# Patient Record
Sex: Male | Born: 1985 | Race: White | Hispanic: No | Marital: Single | Smoking: Former smoker
Health system: Southern US, Community
[De-identification: ages and names within clinical notes are randomized; demographics above are authoritative.]

## PROBLEM LIST (undated history)

## (undated) DIAGNOSIS — F909 Attention-deficit hyperactivity disorder, unspecified type: Secondary | ICD-10-CM

## (undated) HISTORY — PX: COLONOSCOPY WITH PROPOFOL: SHX5780

## (undated) HISTORY — PX: DRUG INDUCED ENDOSCOPY: SHX6808

## (undated) HISTORY — PX: WISDOM TOOTH EXTRACTION: SHX21

---

## 2010-10-17 ENCOUNTER — Encounter: Payer: Self-pay | Admitting: Gastroenterology

## 2010-10-17 ENCOUNTER — Ambulatory Visit (INDEPENDENT_AMBULATORY_CARE_PROVIDER_SITE_OTHER): Payer: Managed Care, Other (non HMO) | Admitting: Gastroenterology

## 2010-10-17 ENCOUNTER — Other Ambulatory Visit (INDEPENDENT_AMBULATORY_CARE_PROVIDER_SITE_OTHER): Payer: Managed Care, Other (non HMO)

## 2010-10-17 VITALS — BP 124/68 | HR 88 | Ht 75.0 in | Wt 162.0 lb

## 2010-10-17 DIAGNOSIS — R197 Diarrhea, unspecified: Secondary | ICD-10-CM

## 2010-10-17 LAB — COMPREHENSIVE METABOLIC PANEL
ALT: 19 U/L (ref 0–53)
AST: 19 U/L (ref 0–37)
Albumin: 4.3 g/dL (ref 3.5–5.2)
Alkaline Phosphatase: 87 U/L (ref 39–117)
Glucose, Bld: 87 mg/dL (ref 70–99)
Potassium: 5 mEq/L (ref 3.5–5.1)
Sodium: 140 mEq/L (ref 135–145)
Total Bilirubin: 0.6 mg/dL (ref 0.3–1.2)
Total Protein: 7.4 g/dL (ref 6.0–8.3)

## 2010-10-17 LAB — CBC WITH DIFFERENTIAL/PLATELET
Basophils Absolute: 0 10*3/uL (ref 0.0–0.1)
Eosinophils Absolute: 0 10*3/uL (ref 0.0–0.7)
Lymphocytes Relative: 35.1 % (ref 12.0–46.0)
MCHC: 34.7 g/dL (ref 30.0–36.0)
MCV: 88.5 fl (ref 78.0–100.0)
Monocytes Absolute: 0.5 10*3/uL (ref 0.1–1.0)
Neutrophils Relative %: 55.1 % (ref 43.0–77.0)
Platelets: 220 10*3/uL (ref 150.0–400.0)
RDW: 13 % (ref 11.5–14.6)

## 2010-10-17 MED ORDER — PEG-KCL-NACL-NASULF-NA ASC-C 100 G PO SOLR: 1.0000 | Freq: Once | ORAL | Status: AC

## 2010-10-17 NOTE — Patient Instructions (Addendum)
Colonoscopy A colonoscopy is an exam to evaluate your entire colon. In this exam, your colon is cleansed. A long fiberoptic tube is inserted through your rectum and into your colon. The fiberoptic scope (endoscope) is a long bundle of enclosed and very flexible fibers. These fibers transmit light to the area examined and send images from that area to your caregiver. Discomfort is usually minimal. You may be given a drug to help you sleep (sedative) during or prior to the procedure. This exam helps to detect lumps (tumors), polyps, inflammation, and areas of bleeding. Your caregiver may also take a small piece of tissue (biopsy) that will be examined under a microscope. BEFORE THE PROCEDURE  A clear liquid diet may be required for 2 days before the exam.   Liquid injections (enemas) or laxatives may be required.   A large amount of electrolyte solution may be given to you to drink over a short period of time. This solution is used to clean out your colon.   You should be present 100   prior to your procedure or as directed by your caregiver.   Check in at the admissions desk to fill out necessary forms if not preregistered. There will be consent forms to sign prior to the procedure. If accompanied by friends or family, there is a waiting area for them while you are having your procedure.  LET YOUR CAREGIVER KNOW ABOUT:  Allergies to food or medicine.  Medicines taken, including vitamins, herbs, eyedrops, over-the-counter medicines, and creams.   Use of steroids (by mouth or creams).   Previous problems with anesthetics or numbing medicines.   History of bleeding problems or blood clots.  Previous surgery.   Other health problems, including diabetes and kidney problems.   Possibility of pregnancy, if this applies.   AFTER THE PROCEDURE  If you received a sedative and/or pain medicine, you will need to arrange for someone to drive you home.   Occasionally, there is a  little blood passed with the first bowel movement. DO NOT be concerned.  HOME CARE INSTRUCTIONS  It is not unusual to pass moderate amounts of gas and experience mild abdominal cramping following the procedure. This is due to air being used to inflate your colon during the exam. Walking or a warm pack on your belly (abdomen) may help.   You may resume all normal meals and activities after sedatives and medicines have worn off.   Only take over-the-counter or prescription medicines for pain, discomfort, or fever as directed by your caregiver. DO NOT use aspirin or blood thinners if a biopsy was taken. Consult your caregiver for medicine usage if biopsies were taken.  FINDING OUT THE RESULTS OF YOUR TEST Not all test results are available during your visit. If your test results are not back during the visit, make an appointment with your caregiver to find out the results. Do not assume everything is normal if you have not heard from your caregiver or the medical facility. It is important for you to follow up on all of your test results. SEEK IMMEDIATE MEDICAL CARE IF:  You have an oral temperature above 100, not controlled by medicine.   You pass large blood clots or fill a toilet with blood following the procedure. This may also occur 10 to 14 days following the procedure. This is more likely if a biopsy was taken.   You develop abdominal pain that keeps getting worse and cannot be relieved  with medicine.  Document Released: 05/17/2000 Document Re-Released: 08/14/2009 [facility] Patient Information 2011 [place], [place].   You have been scheduled for a Colonoscopy . Your prep has been sent to your pharmacy. Please go to the basement lab today for lab work.

## 2010-10-17 NOTE — Assessment & Plan Note (Signed)
Symptoms could be due to diarrhea-predominant IBS or from inflammatory bowel disease. He is not reliably intolerant to dairy products.  Recommendations #1 check CBC, CRP and comprehensive metabolic profile #2 colonoscopy  Risks, alternatives, and complications of the procedure, including bleeding, perforation, and possible need for surgery, were explained to the patient.  Patient's questions were answered.

## 2010-10-17 NOTE — Progress Notes (Signed)
History of Present Illness:  Jose Shaw is a 25 year old white male self-referred for evaluation of diarrhea. For several years he's been having GI issues including severe episodes of diarrhea with urgency and lower abdominal pain. He typically has diarrhea 4 days out of the week. During those days he may go 5-6 times and occasionally at night. He rarely has seen blood on the toilet tissue. Symptoms are not specific to any particular foods. Weight has been stable. There is no family history of GI disease.    Review of Systems: Pertinent positive and negative review of systems were noted in the above HPI section. All other review of systems were otherwise negative.    Current Medications, Allergies, Past Medical History, Past Surgical History, Family History and Social History were reviewed in [employer] electronic medical record  Vital signs were reviewed in today's medical record. Physical Exam: General: Well developed , well nourished, no acute distress Head: Normocephalic and atraumatic Eyes:  sclerae anicteric, EOMI Ears: Normal auditory acuity Mouth: No deformity or lesions Lungs: Clear throughout to auscultation Heart: Regular rate and rhythm; no murmurs, rubs or bruits Abdomen: Soft, non tender and non distended. No masses, hepatosplenomegaly or hernias noted. Normal Bowel sounds Rectal:deferred Musculoskeletal: Symmetrical with no gross deformities  Pulses:  Normal pulses noted Extremities: No clubbing, cyanosis, edema or deformities noted Neurological: Alert oriented x 4, grossly nonfocal Psychological:  Alert and cooperative. Normal mood and affect

## 2010-10-18 LAB — C-REACTIVE PROTEIN: CRP: 0 mg/dL (ref <0.6)

## 2010-10-24 ENCOUNTER — Ambulatory Visit (AMBULATORY_SURGERY_CENTER): Payer: Managed Care, Other (non HMO) | Admitting: Gastroenterology

## 2010-10-24 ENCOUNTER — Encounter: Payer: Self-pay | Admitting: Gastroenterology

## 2010-10-24 DIAGNOSIS — R197 Diarrhea, unspecified: Secondary | ICD-10-CM

## 2010-10-24 DIAGNOSIS — K5150 Left sided colitis without complications: Secondary | ICD-10-CM

## 2010-10-24 MED ORDER — MESALAMINE 1.2 G PO TBEC: 2400.0000 mg | DELAYED_RELEASE_TABLET | Freq: Every day | ORAL | Status: DC

## 2010-10-24 MED ORDER — SODIUM CHLORIDE 0.9 % IV SOLN: 500.0000 mL | INTRAVENOUS | Status: DC

## 2010-10-24 NOTE — Patient Instructions (Signed)
Discharged instructions given with verbal understanding. Biopsies taken only. Resume previous medications.

## 2010-10-25 ENCOUNTER — Telehealth: Payer: Self-pay | Admitting: *Deleted

## 2010-10-25 NOTE — Telephone Encounter (Signed)
No answer.. on home #

## 2014-03-23 ENCOUNTER — Encounter (HOSPITAL_COMMUNITY): Payer: Self-pay | Admitting: Emergency Medicine

## 2014-03-23 ENCOUNTER — Emergency Department (HOSPITAL_COMMUNITY): Payer: BC Managed Care – PPO

## 2014-03-23 ENCOUNTER — Emergency Department (HOSPITAL_COMMUNITY)
Admission: EM | Admit: 2014-03-23 | Discharge: 2014-03-23 | Disposition: A | Discharge status: Home / Self Care | Payer: BC Managed Care – PPO | Attending: Emergency Medicine | Admitting: Emergency Medicine

## 2014-03-23 DIAGNOSIS — M545 Low back pain, unspecified: Secondary | ICD-10-CM

## 2014-03-23 DIAGNOSIS — X58XXXA Exposure to other specified factors, initial encounter: Secondary | ICD-10-CM | POA: Diagnosis not present

## 2014-03-23 DIAGNOSIS — Z79899 Other long term (current) drug therapy: Secondary | ICD-10-CM | POA: Diagnosis not present

## 2014-03-23 DIAGNOSIS — Y92838 Other recreation area as the place of occurrence of the external cause: Secondary | ICD-10-CM | POA: Diagnosis not present

## 2014-03-23 DIAGNOSIS — Z87891 Personal history of nicotine dependence: Secondary | ICD-10-CM | POA: Diagnosis not present

## 2014-03-23 DIAGNOSIS — S39012A Strain of muscle, fascia and tendon of lower back, initial encounter: Secondary | ICD-10-CM | POA: Diagnosis not present

## 2014-03-23 DIAGNOSIS — Y93B3 Activity, free weights: Secondary | ICD-10-CM | POA: Insufficient documentation

## 2014-03-23 DIAGNOSIS — S29002A Unspecified injury of muscle and tendon of back wall of thorax, initial encounter: Secondary | ICD-10-CM | POA: Diagnosis present

## 2014-03-23 MED ORDER — NAPROXEN 500 MG PO TABS: 500.0000 mg | ORAL_TABLET | Freq: Two times a day (BID) | ORAL | Status: DC

## 2014-03-23 MED ORDER — DIAZEPAM 5 MG PO TABS
10.0000 mg | ORAL_TABLET | Freq: Once | ORAL | Status: AC
  Administered 2014-03-23: 10 mg via ORAL
  Filled 2014-03-23: qty 2

## 2014-03-23 MED ORDER — CYCLOBENZAPRINE HCL 10 MG PO TABS: 10.0000 mg | ORAL_TABLET | Freq: Two times a day (BID) | ORAL | Status: DC | PRN

## 2014-03-23 MED ORDER — OXYCODONE-ACETAMINOPHEN 5-325 MG PO TABS
1.0000 | ORAL_TABLET | Freq: Once | ORAL | Status: AC
  Administered 2014-03-23: 1 via ORAL
  Filled 2014-03-23: qty 1

## 2014-03-23 NOTE — ED Provider Notes (Signed)
Medical screening examination/treatment/procedure(s) were performed by non-physician practitioner and as supervising physician I was immediately available for consultation/collaboration.   EKG Interpretation None        Zale Marcotte, MD 03/23/14 0719 

## 2014-03-23 NOTE — ED Notes (Signed)
Pt reporting left lower back pain after working out.  Sts he heard a pop.  Pain radiates down left leg. No tenderness palpated.

## 2014-03-23 NOTE — Discharge Instructions (Signed)

## 2014-03-23 NOTE — ED Provider Notes (Signed)
CSN: [id number]     Arrival date & time 03/23/14  0013 History   First MD Initiated Contact with Patient 03/23/14 0138     Chief Complaint  Patient presents with  . Back Pain     (Consider location/radiation/quality/duration/timing/severity/associated sxs/prior Treatment) Patient is a 28 y.o. male presenting with back pain. The history is provided by the patient. No language interpreter was used.  Back Pain Location:  Lumbar spine Quality:  Cramping and aching Pain severity:  Moderate Onset quality:  Sudden Duration:  3 hours Timing:  Constant Progression:  Unchanged Chronicity:  New Context: lifting heavy objects   Worsened by:  Ambulation Ineffective treatments:  None tried Associated symptoms: no bladder incontinence, no bowel incontinence, no leg pain, no numbness, no paresthesias and no perianal numbness     History reviewed. No pertinent past medical history. History reviewed. No pertinent past surgical history. Family History  Problem Relation Age of Onset  . Colon cancer Neg Hx   . Heart disease Father    History  Substance Use Topics  . Smoking status: Former Games developermoker  . Smokeless tobacco: Never Used  . Alcohol Use: Yes     Comment: occassional one drink a week     Review of Systems  Gastrointestinal: Negative for bowel incontinence.  Genitourinary: Negative for bladder incontinence.  Musculoskeletal: Positive for back pain.  Neurological: Negative for numbness and paresthesias.  All other systems reviewed and are negative.     Allergies  Ceclor  Home Medications   Prior to Admission medications   Medication Sig Start Date End Date Taking? Authorizing Provider  Multiple Vitamin (ONE-A-DAY MENS PO) Take 1 tablet by mouth daily.   Yes Historical Provider, MD  Omega-3 Fatty Acids (FISH OIL PO) Take 1 tablet by mouth daily.   Yes Historical Provider, MD   BP 133/73  Pulse 83  Temp(Src) 98.3 F (36.8 C) (Oral)  Resp 18  Ht 6\' 2"  (1.88 m)  Wt 192  lb (87.091 kg)  BMI 24.64 kg/m2  SpO2 98% Physical Exam  Nursing note and vitals reviewed. Constitutional: He is oriented to person, place, and time. He appears well-developed and well-nourished.  HENT:  Head: Normocephalic.  Eyes: Pupils are equal, round, and reactive to light.  Neck: Normal range of motion.  Cardiovascular: Normal rate, regular rhythm and intact distal pulses.   Pulmonary/Chest: Effort normal and breath sounds normal.  Abdominal: Soft.  Musculoskeletal: He exhibits tenderness.       Lumbar back: He exhibits spasm.       Back:  Lymphadenopathy:    He has no cervical adenopathy.  Neurological: He is alert and oriented to person, place, and time.  Skin: Skin is warm and dry.  Psychiatric: He has a normal mood and affect.    ED Course  Procedures (including critical care time) Labs Review Labs Reviewed - No data to display  Imaging Review Dg Lumbar Spine Complete  03/23/2014   CLINICAL DATA:  Pain in the left lower back radiating down the left leg after feeling a pop while lifting weights.  EXAM: LUMBAR SPINE - COMPLETE 4+ VIEW  COMPARISON:  None.  FINDINGS: There is no evidence of lumbar spine fracture. Alignment is normal. Intervertebral disc spaces are maintained.  IMPRESSION: Negative.   Electronically Signed   By: Burman NievesWilliam  Stevens M.D.   On: 03/23/2014 01:24     EKG Interpretation None      MDM   Final diagnoses:  None    Low back  strain. No red flag symptoms.  Anti-inflammatory, muscle relaxant.  Return precautions discussed.    Jimmye Normanavid John Arieon Scalzo, NP 03/23/14 [phone]

## 2014-03-23 NOTE — ED Notes (Signed)
Pt presents with lower back pain after squatting with weights at the gym and "hearing a pop", denies numbness or tingling in legs.  Denies loss of bowel or bladder.

## 2014-03-23 NOTE — ED Notes (Signed)
Pt ambulatory to the restroom with no difficulty. Gait steady.

## 2015-02-24 IMAGING — CR DG LUMBAR SPINE COMPLETE 4+V
5 series · 5 of 5 positions shown · non-contrast
Comparison: None.

CLINICAL DATA: Pain in the left lower back radiating down the left
leg after feeling a pop while lifting weights.

EXAM:
LUMBAR SPINE - COMPLETE 4+ VIEW

[t lumbar spine ap]
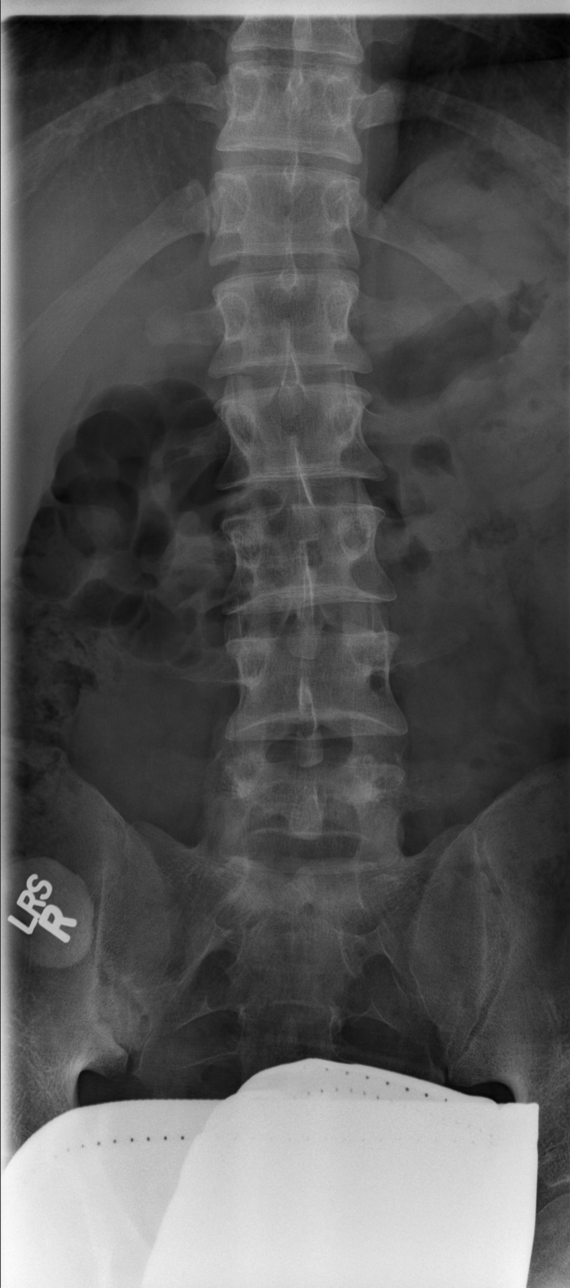

[t lumbar spine obl (1 of 2)]
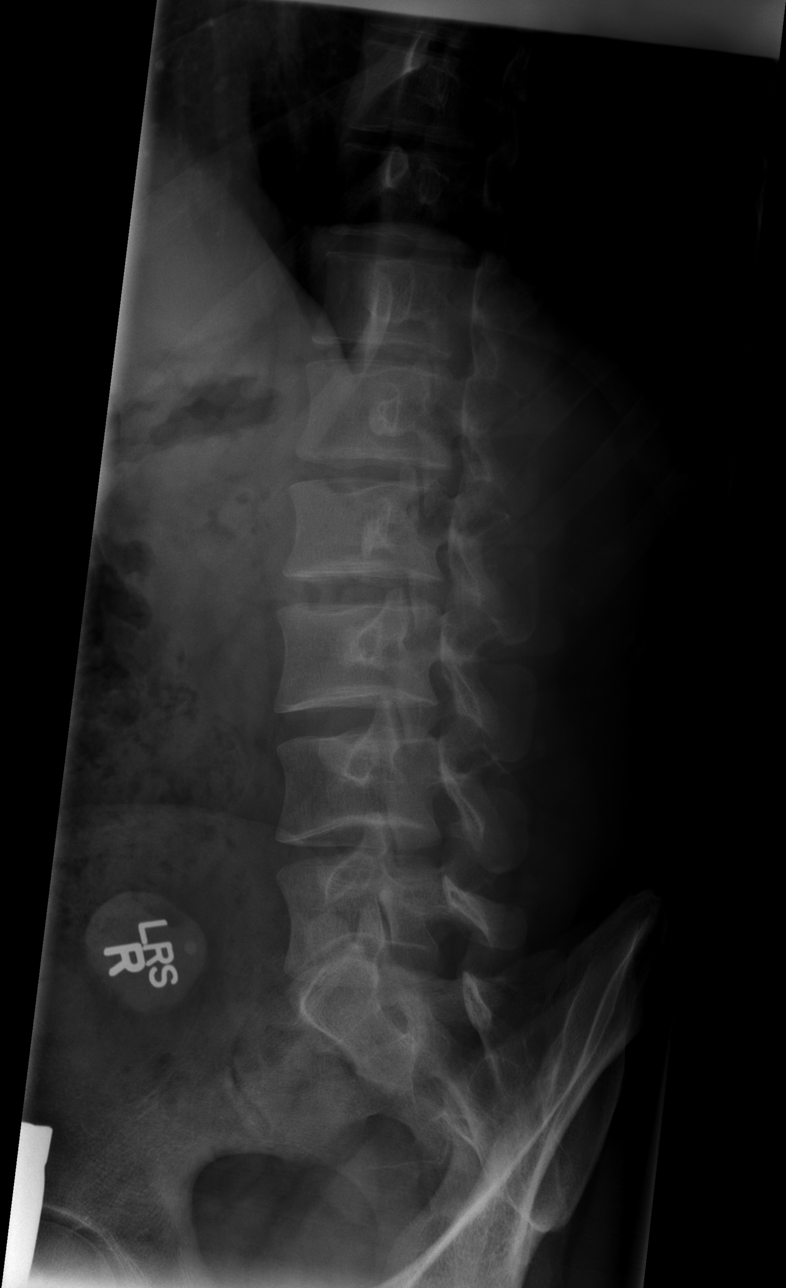

[t lumbar spine obl (2 of 2)]
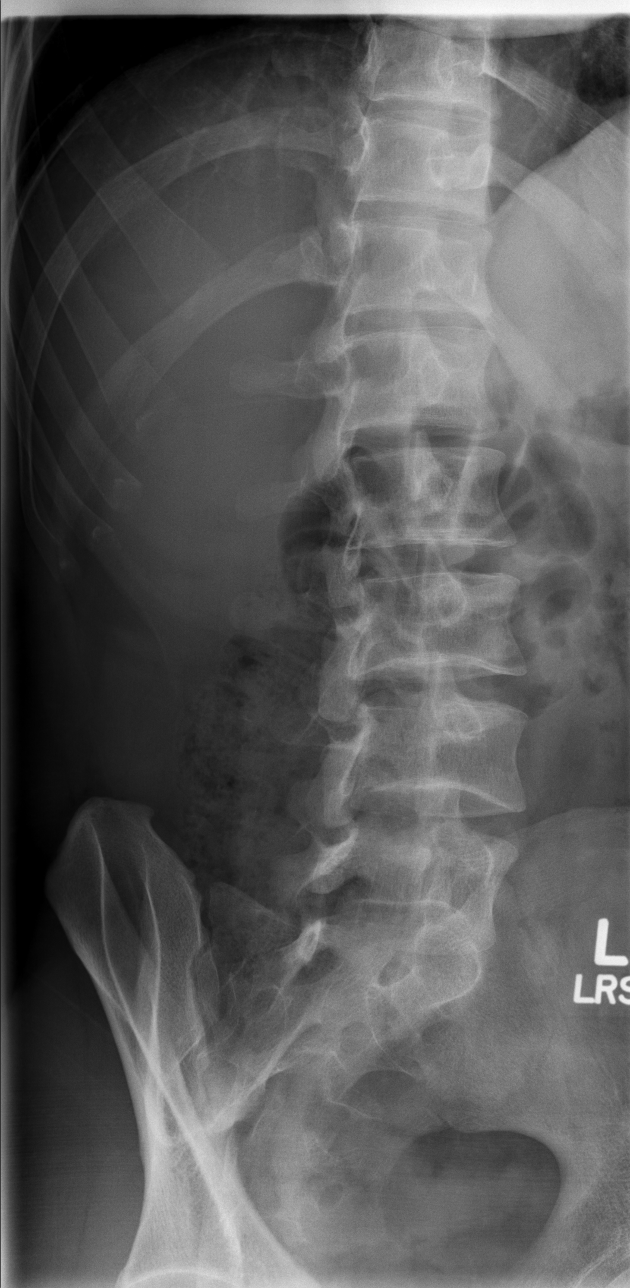

[t lumbar spine lat]
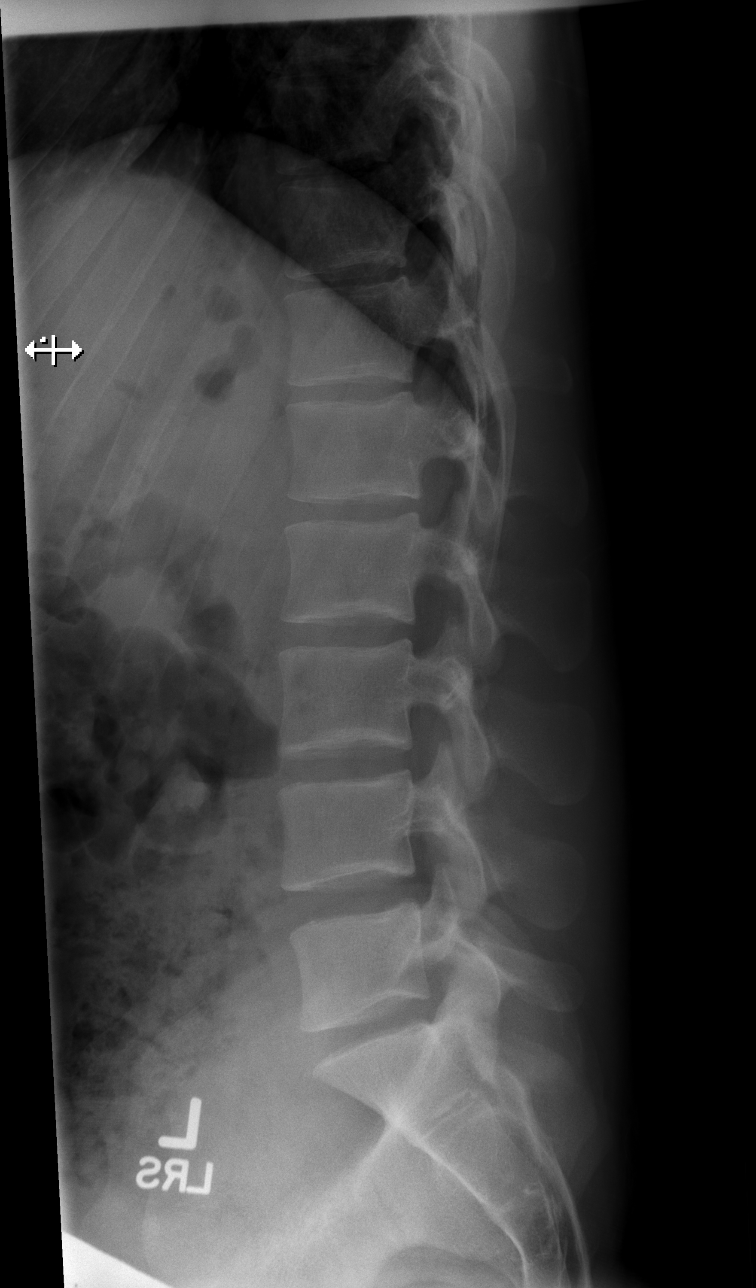

[t lumbar l-5 s-1 spot]
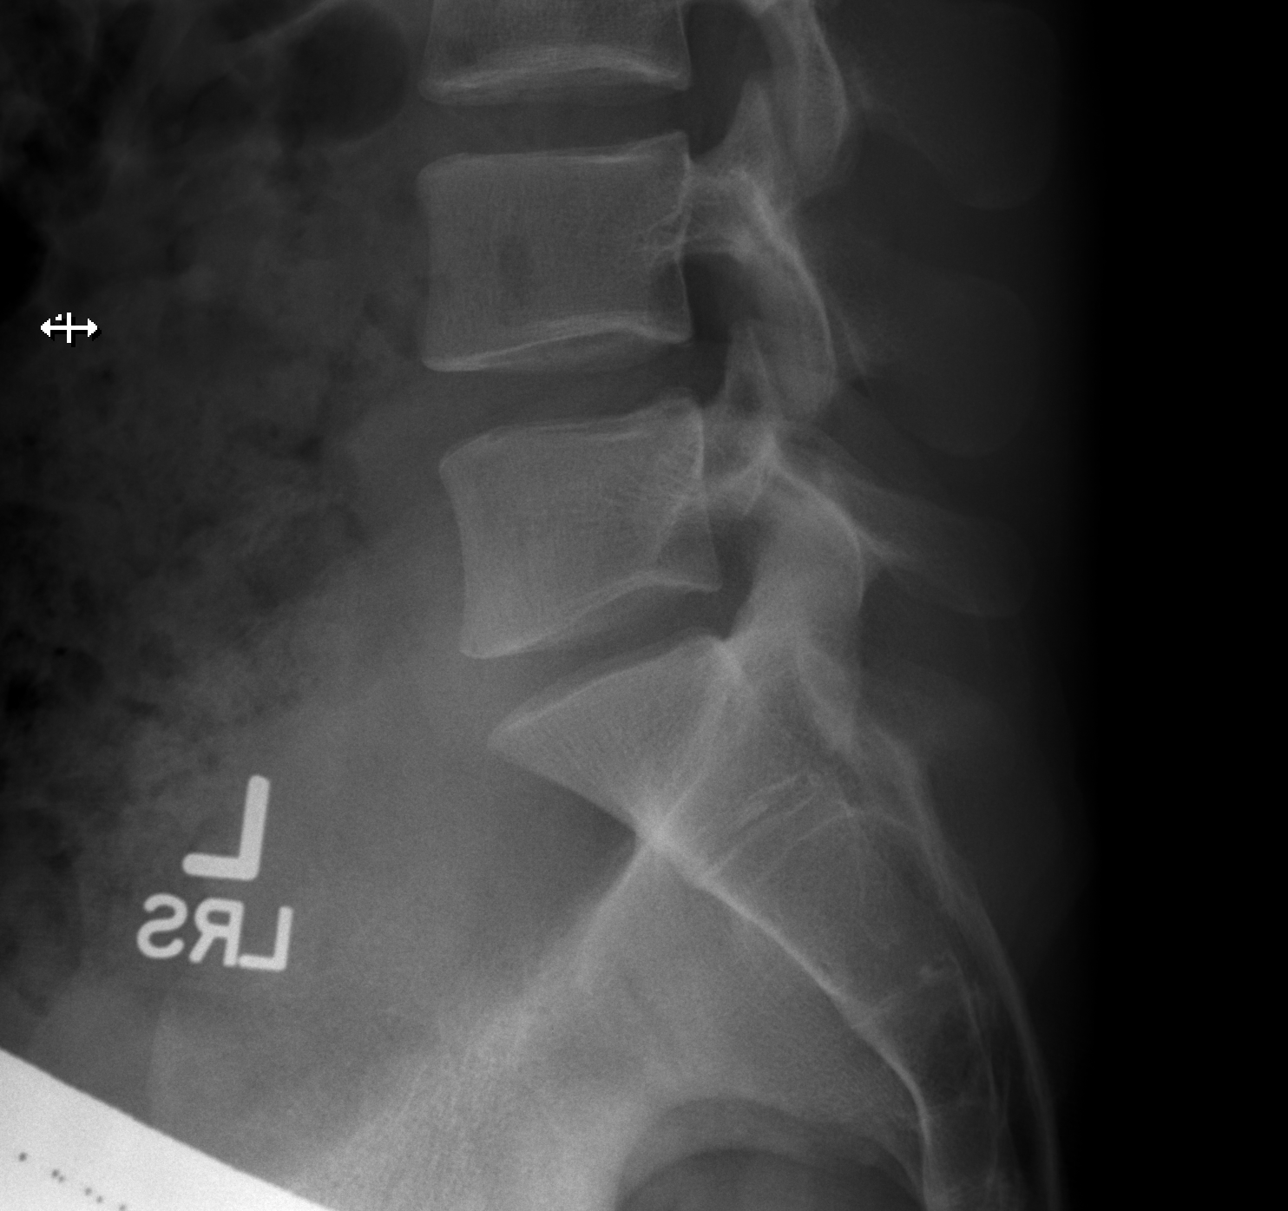

[5 of 5 positions shown; findings below may reference images not displayed]

FINDINGS: There is no evidence of lumbar spine fracture. Alignment is normal.
Intervertebral disc spaces are maintained.
IMPRESSION: Negative.

## 2015-06-13 ENCOUNTER — Ambulatory Visit: Payer: Self-pay

## 2016-01-09 ENCOUNTER — Encounter: Payer: Self-pay | Admitting: Gastroenterology

## 2016-03-27 ENCOUNTER — Encounter: Payer: Self-pay | Admitting: Gastroenterology

## 2016-03-27 ENCOUNTER — Other Ambulatory Visit: Payer: Self-pay

## 2016-03-27 ENCOUNTER — Other Ambulatory Visit (INDEPENDENT_AMBULATORY_CARE_PROVIDER_SITE_OTHER): Payer: BLUE CROSS/BLUE SHIELD

## 2016-03-27 ENCOUNTER — Telehealth: Payer: Self-pay | Admitting: Gastroenterology

## 2016-03-27 ENCOUNTER — Ambulatory Visit (INDEPENDENT_AMBULATORY_CARE_PROVIDER_SITE_OTHER): Payer: BLUE CROSS/BLUE SHIELD | Admitting: Gastroenterology

## 2016-03-27 ENCOUNTER — Encounter (INDEPENDENT_AMBULATORY_CARE_PROVIDER_SITE_OTHER): Payer: Self-pay

## 2016-03-27 VITALS — BP 120/80 | HR 64 | Ht 75.0 in | Wt 176.4 lb

## 2016-03-27 DIAGNOSIS — R152 Fecal urgency: Secondary | ICD-10-CM

## 2016-03-27 DIAGNOSIS — K529 Noninfective gastroenteritis and colitis, unspecified: Secondary | ICD-10-CM | POA: Diagnosis not present

## 2016-03-27 LAB — COMPREHENSIVE METABOLIC PANEL
ALT: 16 U/L (ref 0–53)
AST: 15 U/L (ref 0–37)
Albumin: 4.7 g/dL (ref 3.5–5.2)
Alkaline Phosphatase: 72 U/L (ref 39–117)
BUN: 17 mg/dL (ref 6–23)
CHLORIDE: 103 meq/L (ref 96–112)
CO2: 29 meq/L (ref 19–32)
CREATININE: 1.19 mg/dL (ref 0.40–1.50)
Calcium: 9.8 mg/dL (ref 8.4–10.5)
GFR: 76.92 mL/min (ref >60.00)
Glucose, Bld: 88 mg/dL (ref 70–99)
Potassium: 4.4 mEq/L (ref 3.5–5.1)
Sodium: 139 mEq/L (ref 135–145)
Total Bilirubin: 0.4 mg/dL (ref 0.2–1.2)
Total Protein: 7.4 g/dL (ref 6.0–8.3)

## 2016-03-27 LAB — CBC WITH DIFFERENTIAL/PLATELET
BASOS PCT: 0.9 % (ref 0.0–3.0)
Basophils Absolute: 0 10*3/uL (ref 0.0–0.1)
EOS ABS: 0.1 10*3/uL (ref 0.0–0.7)
Eosinophils Relative: 1 % (ref 0.0–5.0)
HEMATOCRIT: 43.2 % (ref 39.0–52.0)
Hemoglobin: 14.9 g/dL (ref 13.0–17.0)
LYMPHS ABS: 2.1 10*3/uL (ref 0.7–4.0)
Lymphocytes Relative: 38.7 % (ref 12.0–46.0)
MCHC: 34.4 g/dL (ref 30.0–36.0)
MCV: 87.7 fl (ref 78.0–100.0)
Monocytes Absolute: 0.4 10*3/uL (ref 0.1–1.0)
Monocytes Relative: 7.2 % (ref 3.0–12.0)
NEUTROS ABS: 2.9 10*3/uL (ref 1.4–7.7)
NEUTROS PCT: 52.2 % (ref 43.0–77.0)
PLATELETS: 219 10*3/uL (ref 150.0–400.0)
RBC: 4.92 Mil/uL (ref 4.22–5.81)
RDW: 13.4 % (ref 11.5–15.5)
WBC: 5.5 10*3/uL (ref 4.0–10.5)

## 2016-03-27 LAB — TSH: TSH: 2.01 u[IU]/mL (ref 0.35–4.50)

## 2016-03-27 LAB — IGA: IGA: 206 mg/dL (ref 68–378)

## 2016-03-27 LAB — HIGH SENSITIVITY CRP: CRP, High Sensitivity: 0.15 mg/L (ref 0.000–5.000)

## 2016-03-27 MED ORDER — DICYCLOMINE HCL 10 MG PO CAPS: 10.0000 mg | ORAL_CAPSULE | Freq: Three times a day (TID) | ORAL | 3 refills | Status: AC | PRN

## 2016-03-27 NOTE — Patient Instructions (Signed)
If you are age 28 or older, your body mass index should be between 23-30. Your Body mass index is 22.05 kg/m. If this is out of the aforementioned range listed, please consider follow up with your Primary Care Provider.  If you are age 28 or younger, your body mass index should be between 19-25. Your Body mass index is 22.05 kg/m. If this is out of the aformentioned range listed, please consider follow up with your Primary Care Provider.   Your physician has requested that you go to the basement for lab work before leaving today.  We have sent the following medications to your pharmacy for you to pick up at your convenience: Bentyl. Take one tablet every 8 hours as needed.  We have given you a low FODMAP diet.

## 2016-03-27 NOTE — Progress Notes (Signed)
HPI :  30 y/o male with no past medical history, here for follow up visit for chronic loose stools. He has not been seen by our office since 2012.  He reports chronic loose stools for as long as he can remember. He reports after eating mostly anything he has a strong urgency to have a bowel movement. He does not seen blood in the stools, but rarely a scant amount on the toilet paper he thinks is due to hemorrhoids, which is stable and longstanding. He is having on average 3 BMs per day. He has a lot of urgency, typically after a meal. No nocturnal symptoms. Weight has been stable, he has a hard time keeping weight on however - needs to eat a lot and lift weights. He reported having a colonoscopy in 2012 for these symptoms, which showed some mild inflammation on gross exam, but biopsies negative for chronic inflammation. He was given a sample of Lialda to try, which she actually took for up to 8 months without any follow-up. He is not sure if Lialda provided any benefit when he took it.  He generally eats okay. He has rare vomiting. He is nausea which comes and goes. He has some lower abdominal discomfort which can bother him as well. If he has a bowel movement it can relieve some of the discomfort. He endorsed chronic headaches and was taking ibuprofen for a while, he stopped taking this about 8-9 months ago. He denied NSAID use at time of prior colonoscopy.  He reports he has been using lactaid which can help but not always.   No FH of Crohns / colitis. No celiac disease known in the family.   Colonoscopy 10/24/2010 - mild left sided colitis , path shows lmyphoid aggregates and focal hemorrhage, no chronic inflammation noted  No past medical history on file. Patient denies any past medical history  No past surgical history on file.  Patient denies any past surgical history  Family History  Problem Relation Age of Onset  . Heart disease Father   . Colon cancer Neg Hx    Social History    Substance Use Topics  . Smoking status: Former Games developer  . Smokeless tobacco: Never Used  . Alcohol use Yes     Comment: occassional one drink a week    Current Outpatient Prescriptions  Medication Sig Dispense Refill  . lactase (LACTAID) 3000 units tablet Take by mouth 3 (three) times daily with meals.     Current Facility-Administered Medications  Medication Dose Route Frequency Provider Last Rate Last Dose  . 0.9 %  sodium chloride infusion  500 mL Intravenous Continuous Louis Meckel, MD       Allergies  Allergen Reactions  . Ceclor [Cefaclor] Other (See Comments)    Rapid heart beat and temp on 104     Review of Systems: All systems reviewed and negative except where noted in HPI.   Lab Results  Component Value Date   ALT 16 03/27/2016   AST 15 03/27/2016   ALKPHOS 72 03/27/2016   BILITOT 0.4 03/27/2016    Lab Results  Component Value Date   WBC 5.5 03/27/2016   HGB 14.9 03/27/2016   HCT 43.2 03/27/2016   MCV 87.7 03/27/2016   PLT 219.0 03/27/2016   Lab Results  Component Value Date   CREATININE 1.19 03/27/2016   BUN 17 03/27/2016   NA 139 03/27/2016   K 4.4 03/27/2016   CL 103 03/27/2016   CO2 29 03/27/2016  Physical Exam: BP 120/80   Pulse 64   Ht 6\' 3"  (1.905 m)   Wt 176 lb 6.4 oz (80 kg)   BMI 22.05 kg/m  Constitutional: Pleasant,well-developed, male in no acute distress. HEENT: Normocephalic and atraumatic. Conjunctivae are normal. No scleral icterus. Neck supple.  Cardiovascular: Normal rate, regular rhythm.  Pulmonary/chest: Effort normal and breath sounds normal. No wheezing, rales or rhonchi. Abdominal: Soft, nondistended, nontender. There are no masses palpable. No hepatomegaly. Extremities: no edema Lymphadenopathy: No cervical adenopathy noted. Neurological: Alert and oriented to person place and time. Skin: Skin is warm and dry. No rashes noted. Psychiatric: Normal mood and affect. Behavior is normal.   ASSESSMENT AND  PLAN: 30 year old male presenting with chronic loose stools and urgency. Prior evaluation with colonoscopy in 2012 reportedly showed some mild erythema in the left colon, however biopsies showed no evidence of inflammation. He was given trial of Lialda but never followed up, he did not think this provided much benefit and stopped it. His symptoms have essentially been unchanged over the past several years.  I discussed differential with him. I don't think he has IBD based on his last colonoscopy report, but this should be followed up to ensure that he doesn't. We'll check basic labs today as well as a fecal calprotectin. I will also screen him for celiac disease which could present like this, and check TSH to make sure this is normal. It is otherwise quite possible that he has irritable bowel syndrome given the chronicity and stability of the symptoms over time. While his labs are pending I provided him some Bentyl to use as needed for cramps and counseled him on a low FODMAP diet, which he was interested in trying. Once we get the labs back we can discuss further workup if needed, and other treatment options. If fecal calprotectin is positive, he may warrant a repeat colonoscopy. He can otherwise use Imodium in the interim as well.  Ileene PatrickSteven Armbruster, MD [facility] Pager [phone]

## 2016-03-27 NOTE — Telephone Encounter (Signed)
Spoke to patient and he should have received a specimen container. He will go back to the lab and pick this up.

## 2016-04-03 ENCOUNTER — Encounter: Payer: Self-pay | Admitting: Gastroenterology

## 2016-04-03 LAB — CELIAC AB TTG DGP TIGA
ANTIGLIADIN ABS, IGA: 6 U (ref 0–19)
Gliadin IgG: 3 units (ref 0–19)
IGA/IMMUNOGLOBULIN A, SERUM: 191 mg/dL (ref 90–386)
Tissue Transglut Ab: <2 U/mL (ref 0–5)
Transglutaminase IgA: <2 U/mL (ref 0–3)

## 2016-04-04 ENCOUNTER — Other Ambulatory Visit: Payer: BLUE CROSS/BLUE SHIELD

## 2016-04-04 DIAGNOSIS — K529 Noninfective gastroenteritis and colitis, unspecified: Secondary | ICD-10-CM

## 2016-04-11 ENCOUNTER — Encounter: Payer: Self-pay | Admitting: Gastroenterology

## 2016-04-11 LAB — CALPROTECTIN, FECAL: Calprotectin, Fecal: <16 ug/g (ref 0–120)

## 2016-04-11 NOTE — Progress Notes (Signed)
Letter mailed

## 2023-03-24 ENCOUNTER — Ambulatory Visit: Payer: Self-pay | Admitting: General Surgery

## 2023-03-24 NOTE — H&P (Signed)
REFERRING PHYSICIAN:  Elias Else, MD  PROVIDER:  Elenora Gamma, MD  MRN: [id number] DOB: 05-18-1986 DATE OF ENCOUNTER: 03/24/2023  Subjective  Chief Complaint: No chief complaint on file.     History of Present Illness: Jose Shaw is a 37 y.o. male who is seen today as an office consultation at the request of Dr. Robyne Peers for evaluation of No chief complaint on file. .  Patient underwent hemorrhoid banding x3 for symptomatic internal hemorrhoids in July 2024.  He reports mild improvement in bleeding and swelling.  Patient has chronic diarrhea. His biggest complaint is prolapse.  He states that with bending he is having to manually reduce his hemorrhoid.  Review of Systems: A complete review of systems was obtained from the patient.  I have reviewed this information and discussed as appropriate with the patient.  See HPI as well for other ROS.   Medical History: Past Medical History: Diagnosis Date  Anxiety    Patient Active Problem List Diagnosis  Diarrhea   History reviewed. No pertinent surgical history.   Allergies Allergen Reactions  Cefaclor Other (See Comments)   Rapid heart beat and temp on 104  Keflex [Cephalexin] Other (See Comments)   fever   Current Outpatient Medications on File Prior to Visit Medication Sig Dispense Refill  mirtazapine (REMERON) 15 MG tablet Take by mouth    dextroamphetamine-amphetamine (ADDERALL) 15 mg tablet Take 15 mg by mouth 2 (two) times daily    No current facility-administered medications on file prior to visit.   Family History Problem Relation Age of Onset  Obesity Father   High blood pressure (Hypertension) Father   Hyperlipidemia (Elevated cholesterol) Father   Coronary Artery Disease (Blocked arteries around heart) Father     Social History  Tobacco Use Smoking Status Former  Types: Cigarettes  Start date: 2019 Smokeless Tobacco Never    Social History  Socioeconomic  History  Marital status: Married Tobacco Use  Smoking status: Former   Types: Cigarettes   Start date: 2019  Smokeless tobacco: Never Substance and Sexual Activity  Alcohol use: Never  Drug use: Never  Social Drivers of Catering manager Strain: Low Risk  (11/18/2022)  Received from [facility]  Overall Financial Resource Strain (CARDIA)   Difficulty of Paying Living Expenses: Not hard at all Food Insecurity: No Food Insecurity (11/18/2022)  Received [facility]  Hunger Vital Sign   Worried About Running Out of Food in the Last Year: Never true   Ran Out of Food in the Last Year: Never true Transportation Needs: No Transportation Needs (11/18/2022)  Received from [facility] - Transportation   Lack of Transportation (Medical): No   Lack of Transportation (Non-Medical): No  Received from [facility]  Social Network Housing Stability: Low Risk  (11/18/2022)  Received from [facility] Stability Vital Sign   Unable to Pay for Housing in the Last Year: No   Number of Places Lived in the Last Year: 1   In the last 12 months, was there a time when you did not have a steady place to sleep or slept in a shelter (including now)?: No   Objective:   Vitals:  03/24/23 0948 03/24/23 0949 BP: 126/84  Pulse: 91  Temp: 37 C (98.6 F)  SpO2: 97%  Weight: 79.5 kg (175 lb 3.2 oz)  Height: 190.5 cm (6\' 3" )  PainSc:  0-No pain    Exam Gen: NAD Abd: soft Rectal: excellent tone  Labs, Imaging and Diagnostic Testing:  Procedure: Anoscopy Surgeon: Maisie Fus After the risks and benefits were explained, written consent was obtained for above procedure.  A medical assistant chaperone was present thoroughout the entire procedure.  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  He is working on keeping his diarrhea under control with medical management, but continues to have difficulty, especially with prolapse.  On exam he has grade 2 and grade 3 hemorrhoids.  We discussed that trans hemorrhoidal dearterialization might be a good option for him as it has a 95% success rate for prolapse and 97% success for bleeding.  There is a slightly higher risk of recurrence with this compared with hemorrhoidectomy.  We discussed the typical postoperative pain bleeding and urinary retention that can be associated with this surgery.  We discussed that he will need to take 2 to 3 weeks off of work to recover.  All questions were answered.  Patient would like to proceed with surgery  Vanita Panda, MD Colon and Rectal Surgery [facility]

## 2023-04-28 ENCOUNTER — Encounter (HOSPITAL_BASED_OUTPATIENT_CLINIC_OR_DEPARTMENT_OTHER): Payer: Self-pay | Admitting: General Surgery

## 2023-04-28 ENCOUNTER — Other Ambulatory Visit: Payer: Self-pay

## 2023-04-28 NOTE — Progress Notes (Signed)
Spoke w/ via phone for pre-op interview: patient  Lab needs dos: NA Lab results: NA COVID test: patient states asymptomatic no test needed. Arrive at 0700 05/09/23 NPO after MN except clear liquids. Clear liquids from MN until 6 AM Med rec completed. Medications to take morning of surgery: none Diabetic medication: NA Patient instructed to bring photo id and insurance card day of surgery. Patient aware to have driver (ride ) / caregiver for 24 hours after surgery. Wife, Crystal to drive. Special Instructions: NA Patient verbalized understanding of instructions that were given at this phone interview. Patient denies shortness of breath, chest pain, fever, cough at this phone interview.

## 2023-05-09 ENCOUNTER — Other Ambulatory Visit: Payer: Self-pay

## 2023-05-09 ENCOUNTER — Ambulatory Visit (HOSPITAL_BASED_OUTPATIENT_CLINIC_OR_DEPARTMENT_OTHER): Payer: No Typology Code available for payment source | Admitting: Anesthesiology

## 2023-05-09 ENCOUNTER — Encounter (HOSPITAL_BASED_OUTPATIENT_CLINIC_OR_DEPARTMENT_OTHER): Payer: Self-pay | Admitting: General Surgery

## 2023-05-09 ENCOUNTER — Ambulatory Visit (HOSPITAL_BASED_OUTPATIENT_CLINIC_OR_DEPARTMENT_OTHER)
Admission: RE | Admit: 2023-05-09 | Discharge: 2023-05-09 | Disposition: A | Discharge status: Home / Self Care | Payer: No Typology Code available for payment source | Attending: General Surgery | Admitting: General Surgery

## 2023-05-09 ENCOUNTER — Encounter (HOSPITAL_BASED_OUTPATIENT_CLINIC_OR_DEPARTMENT_OTHER)
Admission: RE | Disposition: A | Discharge status: Home / Self Care | Payer: Self-pay | Source: Home / Self Care | Attending: General Surgery

## 2023-05-09 DIAGNOSIS — Z01818 Encounter for other preprocedural examination: Secondary | ICD-10-CM

## 2023-05-09 DIAGNOSIS — K642 Third degree hemorrhoids: Secondary | ICD-10-CM | POA: Insufficient documentation

## 2023-05-09 DIAGNOSIS — Z87891 Personal history of nicotine dependence: Secondary | ICD-10-CM | POA: Diagnosis not present

## 2023-05-09 DIAGNOSIS — K529 Noninfective gastroenteritis and colitis, unspecified: Secondary | ICD-10-CM | POA: Insufficient documentation

## 2023-05-09 HISTORY — DX: Attention-deficit hyperactivity disorder, unspecified type: F90.9

## 2023-05-09 HISTORY — PX: TRANSANAL HEMORRHOIDAL DEARTERIALIZATION: SHX6136

## 2023-05-09 SURGERY — TRANSANAL HEMORRHOIDAL DEARTERIALIZATION
Anesthesia: Monitor Anesthesia Care | Site: Rectum

## 2023-05-09 MED ORDER — SODIUM CHLORIDE 0.9% FLUSH: 3.0000 mL | Freq: Two times a day (BID) | INTRAVENOUS | Status: DC

## 2023-05-09 MED ORDER — ACETAMINOPHEN 500 MG PO TABS
ORAL_TABLET | ORAL | Status: AC
  Filled 2023-05-09: qty 2

## 2023-05-09 MED ORDER — BUPIVACAINE LIPOSOME 1.3 % IJ SUSP
INTRAMUSCULAR | Status: DC | PRN
  Administered 2023-05-09: 20 mL

## 2023-05-09 MED ORDER — PROPOFOL 10 MG/ML IV BOLUS
INTRAVENOUS | Status: DC | PRN
  Administered 2023-05-09 (×3): 20 mg via INTRAVENOUS

## 2023-05-09 MED ORDER — OXYCODONE HCL 5 MG PO TABS: 5.0000 mg | ORAL_TABLET | Freq: Once | ORAL | Status: DC | PRN

## 2023-05-09 MED ORDER — GLYCOPYRROLATE PF 0.2 MG/ML IJ SOSY
PREFILLED_SYRINGE | INTRAMUSCULAR | Status: DC | PRN
  Administered 2023-05-09: .2 mg via INTRAVENOUS

## 2023-05-09 MED ORDER — PROPOFOL 1000 MG/100ML IV EMUL
INTRAVENOUS | Status: AC
  Filled 2023-05-09: qty 100

## 2023-05-09 MED ORDER — ACETAMINOPHEN 500 MG PO TABS
1000.0000 mg | ORAL_TABLET | Freq: Once | ORAL | Status: AC
  Administered 2023-05-09: 1000 mg via ORAL

## 2023-05-09 MED ORDER — DIAZEPAM 5 MG PO TABS: 5.0000 mg | ORAL_TABLET | Freq: Three times a day (TID) | ORAL | 0 refills | Status: AC | PRN

## 2023-05-09 MED ORDER — FENTANYL CITRATE (PF) 100 MCG/2ML IJ SOLN
25.0000 ug | INTRAMUSCULAR | Status: DC | PRN
Start: 2023-05-09 — End: 2023-05-09

## 2023-05-09 MED ORDER — MIDAZOLAM HCL 5 MG/5ML IJ SOLN
INTRAMUSCULAR | Status: DC | PRN
  Administered 2023-05-09: 2 mg via INTRAVENOUS

## 2023-05-09 MED ORDER — FENTANYL CITRATE (PF) 100 MCG/2ML IJ SOLN
INTRAMUSCULAR | Status: DC | PRN
  Administered 2023-05-09 (×2): 50 ug via INTRAVENOUS

## 2023-05-09 MED ORDER — LIDOCAINE HCL (PF) 2 % IJ SOLN
INTRAMUSCULAR | Status: AC
  Filled 2023-05-09: qty 5

## 2023-05-09 MED ORDER — AMISULPRIDE (ANTIEMETIC) 5 MG/2ML IV SOLN: 10.0000 mg | Freq: Once | INTRAVENOUS | Status: DC | PRN

## 2023-05-09 MED ORDER — MIDAZOLAM HCL 2 MG/2ML IJ SOLN
INTRAMUSCULAR | Status: AC
  Filled 2023-05-09: qty 2

## 2023-05-09 MED ORDER — ONDANSETRON HCL 4 MG/2ML IJ SOLN
INTRAMUSCULAR | Status: DC | PRN
  Administered 2023-05-09: 4 mg via INTRAVENOUS

## 2023-05-09 MED ORDER — GABAPENTIN 300 MG PO CAPS
ORAL_CAPSULE | ORAL | Status: AC
  Filled 2023-05-09: qty 1

## 2023-05-09 MED ORDER — OXYCODONE HCL 5 MG PO TABS: 5.0000 mg | ORAL_TABLET | Freq: Four times a day (QID) | ORAL | 0 refills | Status: AC | PRN

## 2023-05-09 MED ORDER — PROPOFOL 10 MG/ML IV BOLUS
INTRAVENOUS | Status: AC
  Filled 2023-05-09: qty 20

## 2023-05-09 MED ORDER — LACTATED RINGERS IV SOLN
INTRAVENOUS | Status: DC
  Administered 2023-05-09: 1000 mL via INTRAVENOUS

## 2023-05-09 MED ORDER — FENTANYL CITRATE (PF) 100 MCG/2ML IJ SOLN
INTRAMUSCULAR | Status: AC
  Filled 2023-05-09: qty 2

## 2023-05-09 MED ORDER — OXYCODONE HCL 5 MG/5ML PO SOLN: 5.0000 mg | Freq: Once | ORAL | Status: DC | PRN

## 2023-05-09 MED ORDER — PROPOFOL 500 MG/50ML IV EMUL
INTRAVENOUS | Status: DC | PRN
  Administered 2023-05-09: 200 ug/kg/min via INTRAVENOUS

## 2023-05-09 MED ORDER — LIDOCAINE 2% (20 MG/ML) 5 ML SYRINGE
INTRAMUSCULAR | Status: DC | PRN
  Administered 2023-05-09: 100 mg via INTRAVENOUS

## 2023-05-09 MED ORDER — BUPIVACAINE-EPINEPHRINE 0.5% -1:200000 IJ SOLN
INTRAMUSCULAR | Status: DC | PRN
  Administered 2023-05-09: 30 mL

## 2023-05-09 SURGICAL SUPPLY — 35 items
BRIEF MESH DISP LRG (UNDERPADS AND DIAPERS) IMPLANT
COVER BACK TABLE 60X90IN (DRAPES) ×1 IMPLANT
DRAPE HYSTEROSCOPY (MISCELLANEOUS) ×1 IMPLANT
DRAPE SHEET LG 3/4 BI-LAMINATE (DRAPES) ×1 IMPLANT
ELECT REM PT RETURN 9FT ADLT (ELECTROSURGICAL) ×1 IMPLANT
ELECTRODE REM PT RTRN 9FT ADLT (ELECTROSURGICAL) ×1 IMPLANT
GAUZE 4X4 16PLY ~~LOC~~+RFID DBL (SPONGE) ×1 IMPLANT
GAUZE PAD ABD 8X10 STRL (GAUZE/BANDAGES/DRESSINGS) IMPLANT
GAUZE SPONGE 4X4 12PLY STRL (GAUZE/BANDAGES/DRESSINGS) IMPLANT
GLOVE BIO SURGEON STRL SZ 6.5 (GLOVE) ×1 IMPLANT
GLOVE BIOGEL PI IND STRL 7.0 (GLOVE) ×1 IMPLANT
GLOVE INDICATOR 6.5 STRL GRN (GLOVE) ×1 IMPLANT
GOWN STRL REUS W/TWL XL LVL3 (GOWN DISPOSABLE) ×1 IMPLANT
HEMOSTAT SURGICEL 4X8 (HEMOSTASIS) IMPLANT
KIT SIGMOIDOSCOPE (SET/KITS/TRAYS/PACK) IMPLANT
KIT SLIDE ONE PROLAPS HEMORR (KITS) IMPLANT
KIT TURNOVER CYSTO (KITS) ×1 IMPLANT
LEGGING LITHOTOMY PAIR STRL (DRAPES) ×1 IMPLANT
LUBRICANT JELLY K Y 4OZ (MISCELLANEOUS) ×1 IMPLANT
NDL HYPO 22X1.5 SAFETY MO (MISCELLANEOUS) ×1 IMPLANT
NEEDLE HYPO 22X1.5 SAFETY MO (MISCELLANEOUS) ×1 IMPLANT
PACK BASIN DAY SURGERY FS (CUSTOM PROCEDURE TRAY) ×1 IMPLANT
PAD ARMBOARD 7.5X6 YLW CONV (MISCELLANEOUS) IMPLANT
PENCIL SMOKE EVACUATOR (MISCELLANEOUS) ×1 IMPLANT
SLEEVE SCD COMPRESS KNEE MED (STOCKING) ×1 IMPLANT
SPIKE FLUID TRANSFER (MISCELLANEOUS) IMPLANT
SPONGE HEMORRHOID 8X3CM (HEMOSTASIS) IMPLANT
SUT CHROMIC 2 0 SH (SUTURE) IMPLANT
SUT CHROMIC 3 0 SH 27 (SUTURE) IMPLANT
SUT VIC AB 2-0 UR6 27 (SUTURE) IMPLANT
SYR CONTROL 10ML LL (SYRINGE) ×1 IMPLANT
TOWEL OR 17X24 6PK STRL BLUE (TOWEL DISPOSABLE) ×1 IMPLANT
TRAY DSU PREP LF (CUSTOM PROCEDURE TRAY) ×1 IMPLANT
TUBE CONNECTING 12X1/4 (SUCTIONS) ×1 IMPLANT
YANKAUER SUCT BULB TIP NO VENT (SUCTIONS) ×1 IMPLANT

## 2023-05-09 NOTE — Anesthesia Preprocedure Evaluation (Signed)
Anesthesia Evaluation  Patient identified by MRN, date of birth, ID band Patient awake    Reviewed: Allergy & Precautions, NPO status , Patient's Chart, lab work & pertinent test results  Airway Mallampati: II  TM Distance: >3 FB Neck ROM: Full    Dental  (+) Dental Advisory Given   Pulmonary former smoker   breath sounds clear to auscultation       Cardiovascular negative cardio ROS  Rhythm:Regular Rate:Normal     Neuro/Psych negative neurological ROS     GI/Hepatic negative GI ROS, Neg liver ROS,,,  Endo/Other  negative endocrine ROS    Renal/GU negative Renal ROS     Musculoskeletal   Abdominal   Peds  Hematology negative hematology ROS (+)   Anesthesia Other Findings   Reproductive/Obstetrics                             Anesthesia Physical Anesthesia Plan  ASA: 1  Anesthesia Plan: MAC   Post-op Pain Management: Tylenol PO (pre-op)* and Minimal or no pain anticipated   Induction:   PONV Risk Score and Plan: 1 and Propofol infusion and Ondansetron  Airway Management Planned: Natural Airway and Simple Face Mask  Additional Equipment:   Intra-op Plan:   Post-operative Plan:   Informed Consent: I have reviewed the patients History and Physical, chart, labs and discussed the procedure including the risks, benefits and alternatives for the proposed anesthesia with the patient or authorized representative who has indicated his/her understanding and acceptance.       Plan Discussed with: CRNA  Anesthesia Plan Comments:        Anesthesia Quick Evaluation

## 2023-05-09 NOTE — Anesthesia Postprocedure Evaluation (Signed)
Anesthesia Post Note  Patient: Jose Shaw  Procedure(s) Performed: TRANSANAL HEMORRHOIDAL DEARTERIALIZATION (Rectum)     Patient location during evaluation: PACU Anesthesia Type: MAC Level of consciousness: awake and alert Pain management: pain level controlled Vital Signs Assessment: post-procedure vital signs reviewed and stable Respiratory status: spontaneous breathing, nonlabored ventilation, respiratory function stable and patient connected to nasal cannula oxygen Cardiovascular status: stable and blood pressure returned to baseline Postop Assessment: no apparent nausea or vomiting Anesthetic complications: no  No notable events documented.  Last Vitals:  Vitals:   05/09/23 0915 05/09/23 0948  BP: 128/85 (!) 124/90  Pulse: 61 66  Resp: 13 17  Temp: (!) 36.3 C 36.5 C  SpO2: 100% 100%    Last Pain:  Vitals:   05/09/23 0948  TempSrc:   PainSc: 0-No pain                 Kennieth Rad

## 2023-05-09 NOTE — Transfer of Care (Signed)
Immediate Anesthesia Transfer of Care Note  Patient: Jose Shaw  Procedure(s) Performed: TRANSANAL HEMORRHOIDAL DEARTERIALIZATION (Rectum)  Patient Location: PACU  Anesthesia Type:MAC  Level of Consciousness: drowsy, patient cooperative, and responds to stimulation  Airway & Oxygen Therapy: Patient Spontanous Breathing and Patient connected to face mask oxygen  Post-op Assessment: Report given to RN  Post vital signs: Reviewed and stable  Last Vitals:  Vitals Value Taken Time  BP 113/71 05/09/23 0845  Temp    Pulse 66 05/09/23 0845  Resp 16 05/09/23 0845  SpO2 100 % 05/09/23 0845  Vitals shown include unfiled device data.  Last Pain:  Vitals:   05/09/23 0715  TempSrc: Oral  PainSc: 4       Patients Stated Pain Goal: 8 (05/09/23 0715)  Complications: No notable events documented.

## 2023-05-09 NOTE — H&P (Signed)
REFERRING PHYSICIAN:  Elias Else, MD   PROVIDER:  Elenora Gamma, MD   MRN: [id number] DOB: [birth year]    Subjective      History of Present Illness: Jose Shaw is a 37 y.o. male who is seen today as an office consultation at the request of Dr. Robyne Peers for evaluation of No chief complaint on file. .  Patient underwent hemorrhoid banding x3 for symptomatic internal hemorrhoids in July 2024.  He reports mild improvement in bleeding and swelling.  Patient has chronic diarrhea. His biggest complaint is prolapse.  He states that with bending he is having to manually reduce his hemorrhoid.   Review of Systems: A complete review of systems was obtained from the patient.  I have reviewed this information and discussed as appropriate with the patient.  See HPI as well for other ROS.     Medical History: Past Medical History: DiagnosisDate            Anxiety                 Patient Active Problem List Diagnosis Diarrhea     History reviewed. No pertinent surgical history.    Allergies AllergenReactions CefaclorOther (See Comments)                         Rapid heart beat and temp on 104 Keflex [Cephalexin]Other (See Comments)                         fever     Current Outpatient Medications on File Prior to Visit MedicationSigDispenseRefill            mirtazapine (REMERON) 15 MG tablet         Take by mouth                                    dextroamphetamine-amphetamine (ADDERALL) 15 mg tablet        Take 15 mg by mouth 2 (two) times daily                 No current facility-administered medications on file prior to visit.     Family History ProblemRelationAge of Onset            Obesity            Father              High blood pressure (Hypertension)   Father              Hyperlipidemia (Elevated cholesterol)            Father              Coronary Artery Disease (Blocked arteries around heart)     Father       Social History   Tobacco  Use Smoking StatusFormer Types:Cigarettes Start date:2019 Smokeless TobaccoNever     Social History   Socioeconomic History Marital status:Married Tobacco Use Smoking status:Former                         Types: Cigarettes                         Start date:       2019 Smokeless tobacco:Never Substance and Sexual Activity Alcohol UYQ:IHKVQ Drug QVZ:DGLOV  Social Drivers of Corporate investment banker Strain: Low Risk  (11/18/2022)             Received [facility]             Overall Financial Resource Strain (CARDIA)                        Difficulty of Paying Living Expenses: Not hard at all Food Insecurity: No Food Insecurity (11/18/2022)             [facility]             Hunger Vital Sign                        Worried About Running Out of Food in the Last Year: Never true                        Ran Out of Food in the Last Year: Never true Transportation Needs: No Transportation Needs (11/18/2022)             Received from [facility] - [facility]                        Lack of Transportation (Medical): No                        Lack of Transportation (Non-Medical): No             Received from [facility]             Social Network Housing Stability: Low Risk  (11/18/2022)             Received from [facility] Stability Vital Sign                        Unable to Pay for Housing in the Last Year: No                        Number of Places Lived in the Last Year: 1                        In the last 12 months, was there a time when you did not have a steady place to sleep or slept in a shelter (including now)?: No     Objective:     Vitals:   05/09/23 0715  BP: 133/89  Pulse: 76  Resp: 17  Temp: (!) 97.4 F (36.3 C)  SpO2: 98%      Exam Gen: NAD CV: RRR Pulm: CTA Abd: soft Rectal: excellent tone     Labs, Imaging and Diagnostic Testing:   Procedure: Anoscopy Surgeon:  Maisie Fus After the risks and benefits were explained, written consent was obtained for above procedure.  A medical assistant chaperone was present thoroughout the entire procedure.  Anesthesia: none Diagnosis: hemorrhoids Findings: Grade 2 left lateral and right anterior hemorrhoid, grade 3 right posterior hemorrhoid     Assessment and Plan: Diagnoses and all orders for this visit:   Prolapsed internal hemorrhoids, grade 10       37 year old male with a  lifelong history of chronic diarrhea who presents to the office with complaints of hemorrhoid prolapse and irritation with multiple bowel movements.  He is working on keeping his diarrhea under control with medical management, but continues to have difficulty, especially with prolapse.  On exam he has grade 2 and grade 3 hemorrhoids.  We discussed that trans hemorrhoidal dearterialization might be a good option for him as it has a 95% success rate for prolapse and 97% success for bleeding.  There is a slightly higher risk of recurrence with this compared with hemorrhoidectomy.  We discussed the typical postoperative pain bleeding and urinary retention that can be associated with this surgery.  We discussed that he will need to take 2 to 3 weeks off of work to recover.  All questions were answered.  Patient would like to proceed with surgery   Jose Panda, MD Colon and Rectal Surgery [facility]

## 2023-05-09 NOTE — Discharge Instructions (Addendum)
ANORECTAL SURGERY: POST OP INSTRUCTIONS Take your usually prescribed home medications unless otherwise directed. DIET: During the first few hours after surgery sip on some liquids until you are able to urinate.  It is normal to not urinate for several hours after this surgery.  If you feel uncomfortable, please contact the office for instructions.  After you are able to urinate,you may eat, if you feel like it.  Follow a light bland diet the first 24 hours after arrival home, such as soup, liquids, crackers, etc.  Be sure to include lots of fluids daily (6-8 glasses).  Avoid fast food or heavy meals, as your are more likely to get nauseated.  Eat a low fat diet the next few days after surgery.  Limit caffeine intake to 1-2 servings a day. PAIN CONTROL: Pain is best controlled by a usual combination of several different methods TOGETHER: Muscle relaxation  Soak in a warm bath (or Sitz bath) three times a day and after bowel movements.  Continue to do this until all pain is resolved. Take the muscle relaxer (Valium) every 6 hours for the first 2 days after surgery  Over the counter pain medication Prescription pain medication Most patients will experience some swelling and discomfort in the anus/rectal area and incisions.  Heat such as warm towels, sitz baths, warm baths, etc to help relax tight/sore spots and speed recovery.  Some people prefer to use ice, especially in the first couple days after surgery, as it may decrease the pain and swelling, or alternate between ice & heat.  Experiment to what works for you.  Swelling and bruising can take several weeks to resolve.  Pain can take even longer to completely resolve. It is helpful to take an over-the-counter pain medication regularly for the first few weeks.  Choose one of the following that works best for you: Naproxen (Aleve, etc)  Two 220mg  tabs twice a day Ibuprofen (Advil, etc) Three 200mg  tabs four times a day (every meal & bedtime) A   prescription for pain medication (such as percocet, oxycodone, hydrocodone, etc) should be given to you upon discharge.  Take your pain medication as prescribed.  If you are having problems/concerns with the prescription medicine (does not control pain, nausea, vomiting, rash, itching, etc), please call us [phone] to see if we need to switch you to a different pain medicine that will work better for you and/or control your side effect better. If you need a refill on your pain medication, please contact your pharmacy.  They will contact our office to request authorization. Prescriptions will not be filled after 5 pm or on week-ends. KEEP YOUR BOWELS REGULAR and AVOID CONSTIPATION The goal is one to two soft bowel movements a day.  You should at least have a bowel movement every other day. Avoid getting constipated.  Between the surgery and the pain medications, it is common to experience some constipation. This can be very painful after rectal surgery.  Increasing fluid intake and taking a fiber supplement (such as Metamucil, Citrucel, FiberCon, etc) 1-2 times a day regularly will usually help prevent this problem from occurring.  A stool softener like colace is also recommended.  This can be purchased over the counter at your pharmacy.  You can take it up to 3 times a day.  If you do not have a bowel movement after 24 hrs since your surgery, take one does of milk of magnesia.  If you still haven't had a bowel movement 8-12 hours after  that dose, take another dose.  If you don't have a bowel movement 48 hrs after surgery, purchase a Fleets enema from the drug store and administer gently per package instructions.  If you still are having trouble with your bowel movements after that, please call the office for further instructions. If you develop diarrhea or have many loose bowel movements, simplify your diet to bland foods & liquids for a few days.  Stop any stool softeners and decrease your fiber  supplement.  Switching to mild anti-diarrheal medications (Kayopectate, Pepto Bismol) can help.  If this worsens or does not improve, please call us.  Wound Care Remove your bandages before your first bowel movement or 8 hours after surgery.     Remove any wound packing material at this tim,e as well.  You do not need to repack the wound unless instructed otherwise.  Wear an absorbent pad or soft cotton gauze in your underwear to catch any drainage and help keep the area clean. You should change this every 2-3 hours while awake. Keep the area clean and dry.  Bathe / shower every day, especially after bowel movements.  Keep the area clean by showering / bathing over the incision / wound.   It is okay to soak an open wound to help wash it.  Wet wipes or showers / gentle washing after bowel movements is often less traumatic than regular toilet paper. You may have some styrofoam-like soft packing in the rectum which will come out with the first bowel movement.  You will often notice bleeding with bowel movements.  This should slow down by the end of the first week of surgery Expect some drainage.  This should slow down, too, by the end of the first week of surgery.  Wear an absorbent pad or soft cotton gauze in your underwear until the drainage stops. Do Not sit on a rubber or pillow ring.  This can make you symptoms worse.  You may sit on a soft pillow if needed.  ACTIVITIES as tolerated:   You may resume regular (light) daily activities beginning the next day--such as daily self-care, walking, climbing stairs--gradually increasing activities as tolerated.  If you can walk 30 minutes without difficulty, it is safe to try more intense activity such as jogging, treadmill, bicycling, low-impact aerobics, swimming, etc. Save the most intensive and strenuous activity for last such as sit-ups, heavy lifting, contact sports, etc  Refrain from any heavy lifting or straining until you are off narcotics for pain  control.   You may drive when you are no longer taking prescription pain medication, you can comfortably sit for long periods of time, and you can safely maneuver your car and apply brakes. You may have sexual intercourse when it is comfortable.  FOLLOW UP in our office Please call [facility] at [phone] to set up an appointment to see your surgeon in the office for a follow-up appointment approximately 3-4 weeks after your surgery. Make sure that you call for this appointment the day you arrive home to insure a convenient appointment time. 10. IF YOU HAVE DISABILITY OR FAMILY LEAVE FORMS, BRING THEM TO THE OFFICE FOR PROCESSING.  DO NOT GIVE THEM TO YOUR DOCTOR.     WHEN TO CALL us [phone]: Poor pain control Reactions / problems with new medications (rash/itching, nausea, etc)  Fever over 101.5 F (38.5 C) Inability to urinate Nausea and/or vomiting Worsening swelling or bruising Continued bleeding from incision. Increased pain, redness, or drainage from the  incision  The clinic staff is available to answer your questions during regular business hours (8:30am-5pm).  Please don't hesitate to call and ask to speak to one of our nurses for clinical concerns.   A surgeon from [facility] is always on call at the hospitals   If you have a medical emergency, go to the nearest emergency room or call 911.    [facility] [address], [place], [place]  [address] ? MAIN: [phone] ? TOLL FREE: [phone] ? FAX [phone] [email or web address]     No acetaminophen/Tylenol until after 1:30pm today if needed for pain.  Information for Discharge Teaching: EXPAREL (bupivacaine liposome injectable suspension)   Pain relief is important to your recovery. The goal is to control your pain so you can move easier and return to your normal activities as soon as possible after your procedure. Your physician may use several  types of medicines to manage pain, swelling, and more.  Your surgeon or anesthesiologist gave you EXPAREL(bupivacaine) to help control your pain after surgery.  EXPAREL is a local anesthetic designed to release slowly over an extended period of time to provide pain relief by numbing the tissue around the surgical site. EXPAREL is designed to release pain medication over time and can control pain for up to 72 hours. Depending on how you respond to EXPAREL, you may require less pain medication during your recovery. EXPAREL can help reduce or eliminate the need for opioids during the first few days after surgery when pain relief is needed the most. EXPAREL is not an opioid and is not addictive. It does not cause sleepiness or sedation.   Important! A teal colored band has been placed on your arm with the date, time and amount of EXPAREL you have received. Please leave this armband in place for the full 96 hours following administration, and then you may remove the band. If you return to the hospital for any reason within 96 hours following the administration of EXPAREL, the armband provides important information that your health care providers to know, and alerts them that you have received this anesthetic.    Possible side effects of EXPAREL: Temporary loss of sensation or ability to move in the area where medication was injected. Nausea, vomiting, constipation Rarely, numbness and tingling in your mouth or lips, lightheadedness, or anxiety may occur. Call your doctor right away if you think you may be experiencing any of these sensations, or if you have other questions regarding possible side effects.  Follow all other discharge instructions given to you by your surgeon or nurse. Eat a healthy diet and drink plenty of water or other fluids.  Band to be removed on Tuesday, December 17th.    Post Anesthesia Home Care Instructions  Activity: Get plenty of rest for the remainder of the day. A  responsible individual must stay with you for 24 hours following the procedure.  For the next 24 hours, DO NOT: -Drive a car -Advertising copywriter -Drink alcoholic beverages -Take any medication unless instructed by your physician -Make any legal decisions or sign important papers.  Meals: Start with liquid foods such as gelatin or soup. Progress to regular foods as tolerated. Avoid greasy, spicy, heavy foods. If nausea and/or vomiting occur, drink only clear liquids until the nausea and/or vomiting subsides. Call your physician if vomiting continues.  Special Instructions/Symptoms: Your throat may feel dry or sore from the anesthesia or the breathing tube placed in your throat during  surgery. If this causes discomfort, gargle with warm salt water. The discomfort should disappear within 24 hours.

## 2023-05-09 NOTE — Op Note (Signed)
05/09/2023  8:35 AM  PATIENT:  Jose Shaw  37 y.o. male  Patient Care Team: Patient, No Pcp Per as PCP - General (General Practice)  PRE-OPERATIVE DIAGNOSIS:  GRADE 3 HEMORRHOIDS  POST-OPERATIVE DIAGNOSIS:  GRADE 3 HEMORRHOIDS  PROCEDURE:  TRANSANAL HEMORRHOIDAL DEARTERIALIZATION   Surgeon(s): Romie Levee, MD  ASSISTANT: none   ANESTHESIA:   local and MAC  EBL:  Total I/O In: 400 [I.V.:400] Out: 20 [Blood:20]  DRAINS: none   SPECIMEN:  No Specimen  DISPOSITION OF SPECIMEN:  N/A  COUNTS:  YES  PLAN OF CARE: Discharge to home after PACU  PATIENT DISPOSITION:  PACU - hemodynamically stable.  INDICATION: 37 y.o. M with chronic diarrhea and prolapsing hemorrhoids.    OR FINDINGS: Grade 3 RA and RP, grade 2 LL  Description: Informed consent was confirmed. Patient underwent general anesthesia without difficulty. Patient was placed into lithotomy positioning.  The perianal region was prepped and draped in sterile fashion. Surgical time out confirmed or plan.  I did digital rectal examination and then transitioned over to anoscopy to get a sense of the anatomy.  Rectal block was performed using 0.5% Marcaine with Epinephrine mixed with Experel.  I switched over to the [facility] fiberoptically lit Doppler anocope.   Using the Doppler on the tip of the THD anoscope, I identified the arterial hemorrhoidal vessels coming in in the classic hexagonal anatomical pattern (right posterior/lateral/anterior, left posterior /lateral/anterior).    I proceeded to ligate the hemorrhoidal arteries. I used a 2-0 Vicryl suture on a UR-6 needle in a figure-of-eight fashion over the signal around 6 cm proximal to the anal verge. I then ran that stitch longitudinally more distally to the dentate line. I then tied that stitch down to cause a hemorrhoidopexy. I did that for all 6 locations.    I redid Doppler anoscopy. I Identified a signal at the anterior midline and R posterior locations.  I  isolated and ligated both of these with a figure-of-eight stitch. Signals went away.  At completion of this, all hemorrhoids were reduced into the rectum.  There is no more prolapse. External anatomy looked normal. I repeated anoscopy and examination.   Hemostasis was good.  Patient is being extubated go to recovery room.  I am about to discuss the patient's status to the family.    Vanita Panda, MD  Colorectal and General Surgery [facility]

## 2023-05-12 ENCOUNTER — Encounter (HOSPITAL_BASED_OUTPATIENT_CLINIC_OR_DEPARTMENT_OTHER): Payer: Self-pay | Admitting: General Surgery
